# Patient Record
Sex: Female | Born: 1970 | Race: White | Hispanic: No | Marital: Married | Smoking: Never smoker
Health system: Southern US, Community
[De-identification: ages and names within clinical notes are randomized; demographics above are authoritative.]

---

## 2015-03-22 ENCOUNTER — Other Ambulatory Visit: Payer: Self-pay | Admitting: Nurse Practitioner

## 2015-03-22 DIAGNOSIS — M79604 Pain in right leg: Secondary | ICD-10-CM

## 2015-03-22 DIAGNOSIS — M79605 Pain in left leg: Principal | ICD-10-CM

## 2015-03-29 ENCOUNTER — Other Ambulatory Visit: Payer: Self-pay

## 2015-03-29 ENCOUNTER — Other Ambulatory Visit: Payer: Self-pay | Admitting: Nurse Practitioner

## 2015-03-29 DIAGNOSIS — M79604 Pain in right leg: Secondary | ICD-10-CM

## 2015-03-29 DIAGNOSIS — M79605 Pain in left leg: Principal | ICD-10-CM

## 2015-04-06 ENCOUNTER — Ambulatory Visit
Admission: RE | Admit: 2015-04-06 | Discharge: 2015-04-06 | Disposition: A | Discharge status: Home / Self Care | Payer: BLUE CROSS/BLUE SHIELD | Source: Ambulatory Visit | Attending: Nurse Practitioner | Admitting: Nurse Practitioner

## 2015-04-06 ENCOUNTER — Other Ambulatory Visit: Payer: Self-pay

## 2015-04-06 DIAGNOSIS — M79605 Pain in left leg: Principal | ICD-10-CM

## 2015-04-06 DIAGNOSIS — M79604 Pain in right leg: Secondary | ICD-10-CM | POA: Insufficient documentation

## 2015-04-06 NOTE — Consult Note (Signed)
Chief Complaint: Chief Complaint  Patient presents with  . Varicose Veins    Consult for E & M of Varicose Veins      Referring Physician(s): Fedziuk,Bernadette H  History of Present Illness: Katrina Richards is a 44 y.o. female with complaints of intermittent bilateral leg pain. The symptoms started approximately 1 year ago. The pain is very sharp and usually resolves instantaneously. The pain is intermittent and not reproducible. She says that the symptoms are primarily below the knees in the calves. The symptoms can occur during rest and exercise. No evidence for leg swelling or erythema. Patient had a nerve conduction test which was negative. Patient has no exercise limitations due to these symptoms. She will take ibuprofen occasionally for the pain. Past medical history significant for back pain.  No past medical history on file.  No past surgical history on file.  Allergies: Review of patient's allergies indicates no known allergies.  Medications: Prior to Admission medications   Not on File     No family history on file.  History   Social History  . Marital Status: Married    Spouse Name: N/A  . Number of Children: N/A  . Years of Education: N/A   Social History Main Topics  . Smoking status: Never Smoker   . Smokeless tobacco: Never Used  . Alcohol Use: No  . Drug Use: No  . Sexual Activity: Not on file   Other Topics Concern  . None   Social History Narrative  . None     Review of Systems  Musculoskeletal: Positive for back pain.    Vital Signs: BP 98/64 mmHg  Pulse 88  Temp(Src) 98.1 F (36.7 C) (Oral)  Resp 15  Ht  (1.549 m)  Wt 124 lb (56.246 kg)  BMI 23.44 kg/m2  SpO2 100%  LMP 03/31/2015 (Approximate)  Physical Exam  Constitutional: She appears well-developed and well-nourished.  Musculoskeletal:  No lower extremity swelling.  Strong palpable pulses at [facility] and PT.  No erythema. No obvious varicocities Small spider veins  along lateral left calf.       Imaging: US Venous Img Lower Bilateral  04/06/2015   CLINICAL DATA:  44 year old with intermittent bilateral calf pain. Evaluate for venous insufficiency.  EXAM: BILATERAL LOWER EXTREMITY VENOUS DUPLEX ULTRASOUND  TECHNIQUE: Gray-scale sonography with graded compression, as well as color Doppler and duplex ultrasound, were performed to evaluate the deep and superficial veins of both lower extremities. Spectral Doppler was utilized to evaluate flow at rest and with distal augmentation maneuvers. A complete superficial venous insufficiency exam was performed in the upright standing position. I personally performed the technical portion of the exam.  COMPARISON:  None.  FINDINGS: Right lower extremity: Normal compressibility, augmentation and color Doppler flow in the right common femoral vein, right femoral vein and right popliteal vein. The right saphenofemoral junction is patent.  Normal caliber of the right great saphenous vein and right short saphenous. No evidence for venous reflux.  Left lower extremity: Normal compressibility, augmentation and color Doppler flow in the left common femoral vein, left femoral vein and left popliteal vein. The left saphenofemoral junction is patent. Visualized deep left calf veins are patent.  Normal caliber of the left great saphenous vein and left short saphenous vein. No evidence for venous reflux.  IMPRESSION: Normal lower extremity venous examination. Negative for deep vein thrombosis. Negative for superficial venous insufficiency.   Electronically Signed   By: Richarda Overlie M.D.   On:  04/06/2015 10:29    Labs:  CBC: No results for input(s): WBC, HGB, HCT, PLT in the last 8760 hours.  COAGS: No results for input(s): INR, APTT in the last 8760 hours.  BMP: No results for input(s): NA, K, CL, CO2, GLUCOSE, BUN, CALCIUM, CREATININE, GFRNONAA, GFRAA in the last 8760 hours.  Invalid input(s): CMP    Assessment and  Plan:  44 year old with intermittent lower extremity pain. There is no evidence for lower extremity venous insufficiency by physical examination or ultrasound. I explained these findings to the patient. Unfortunately, I do not have a good explanation for her lower extremity symptoms. Patient asked me if an MRI would be useful. I think that an MRI of the lower extremities is going to be low yield since the pain is intermittent and there is not a focal area of concern.  No evidence for venous insufficiency and no plans for treatment. Patient will follow-up as needed.  Thank you for this interesting consult.  I greatly enjoyed meeting Miachel RouxSusan Bremner and look forward to participating in their care.  SignedAbundio Miu: Kimberely Mccannon RYAN 04/06/2015, 11:54 AM   I spent a total of  10 Minutes   in face to face in clinical consultation, greater than 50% of which was counseling/coordinating care for lower extremity pain.

## 2015-08-01 ENCOUNTER — Other Ambulatory Visit: Payer: Self-pay | Admitting: Nurse Practitioner

## 2015-08-01 DIAGNOSIS — G8929 Other chronic pain: Secondary | ICD-10-CM

## 2015-08-01 DIAGNOSIS — R51 Headache: Principal | ICD-10-CM

## 2015-08-04 ENCOUNTER — Ambulatory Visit
Admission: RE | Admit: 2015-08-04 | Discharge: 2015-08-04 | Disposition: A | Discharge status: Home / Self Care | Payer: BLUE CROSS/BLUE SHIELD | Source: Ambulatory Visit | Attending: Nurse Practitioner | Admitting: Nurse Practitioner

## 2015-08-04 DIAGNOSIS — G8929 Other chronic pain: Secondary | ICD-10-CM

## 2015-08-04 DIAGNOSIS — R51 Headache: Principal | ICD-10-CM

## 2020-01-31 ENCOUNTER — Other Ambulatory Visit: Payer: Self-pay | Admitting: Orthopedic Surgery

## 2020-01-31 DIAGNOSIS — M542 Cervicalgia: Secondary | ICD-10-CM

## 2020-02-08 ENCOUNTER — Other Ambulatory Visit: Payer: BLUE CROSS/BLUE SHIELD

## 2020-02-08 ENCOUNTER — Other Ambulatory Visit: Payer: Self-pay

## 2020-02-08 ENCOUNTER — Ambulatory Visit
Admission: RE | Admit: 2020-02-08 | Discharge: 2020-02-08 | Disposition: A | Discharge status: Home / Self Care | Payer: 59 | Source: Ambulatory Visit | Attending: Orthopedic Surgery | Admitting: Orthopedic Surgery

## 2020-02-08 DIAGNOSIS — M542 Cervicalgia: Secondary | ICD-10-CM

## 2020-02-22 ENCOUNTER — Ambulatory Visit
Admission: RE | Admit: 2020-02-22 | Discharge: 2020-02-22 | Disposition: A | Discharge status: Home / Self Care | Payer: 59 | Source: Ambulatory Visit | Attending: Orthopedic Surgery | Admitting: Orthopedic Surgery

## 2020-02-22 DIAGNOSIS — M542 Cervicalgia: Secondary | ICD-10-CM

## 2020-11-06 ENCOUNTER — Other Ambulatory Visit: Payer: Self-pay

## 2020-11-06 ENCOUNTER — Ambulatory Visit (INDEPENDENT_AMBULATORY_CARE_PROVIDER_SITE_OTHER): Payer: 59 | Admitting: Otolaryngology

## 2020-11-06 ENCOUNTER — Encounter (INDEPENDENT_AMBULATORY_CARE_PROVIDER_SITE_OTHER): Payer: Self-pay | Admitting: Otolaryngology

## 2020-11-06 VITALS — Temp 97.9°F

## 2020-11-06 DIAGNOSIS — J310 Chronic rhinitis: Secondary | ICD-10-CM

## 2020-11-06 NOTE — Progress Notes (Signed)
HPI: Katrina Richards is a 49 y.o. female who presents for evaluation of nasal sinus complaints.  She feels like there is something in her left nostril or left maxillary sinus for couple months now.  She uses Flonase on a regular basis mostly in the mornings.  She also complains of occasional trouble swallowing where she has to almost "push" the food down.  She has mild nasal congestion.  She does have history of cervical disc problems. Patient had a CT scan of her head performed in 2016 because of frontal sinus pressure and on review of the CT scan in 2016 this showed clear paranasal sinuses.  No past medical history on file.  Social History   Socioeconomic History  . Marital status: Married    Spouse name: Not on file  . Number of children: Not on file  . Years of education: Not on file  . Highest education level: Not on file  Occupational History  . Not on file  Tobacco Use  . Smoking status: Never Smoker  . Smokeless tobacco: Never Used  Substance and Sexual Activity  . Alcohol use: No    Alcohol/week: 0.0 standard drinks  . Drug use: No  . Sexual activity: Not on file  Other Topics Concern  . Not on file  Social History Narrative  . Not on file   Social Determinants of Health   Financial Resource Strain:   . Difficulty of Paying Living Expenses: Not on file  Food Insecurity:   . Worried About Programme researcher, broadcasting/film/video in the Last Year: Not on file  . Ran Out of Food in the Last Year: Not on file  Transportation Needs:   . Lack of Transportation (Medical): Not on file  . Lack of Transportation (Non-Medical): Not on file  Physical Activity:   . Days of Exercise per Week: Not on file  . Minutes of Exercise per Session: Not on file  Stress:   . Feeling of Stress : Not on file  Social Connections:   . Frequency of Communication with Friends and Family: Not on file  . Frequency of Social Gatherings with Friends and Family: Not on file  . Attends Religious Services: Not on file  .  Active Member of Clubs or Organizations: Not on file  . Attends Banker Meetings: Not on file  . Marital Status: Not on file   No family history on file. No Known Allergies Prior to Admission medications   Not on File     Positive ROS: Otherwise negative  All other systems have been reviewed and were otherwise negative with the exception of those mentioned in the HPI and as above.  Physical Exam: Constitutional: Alert, well-appearing, no acute distress Ears: External ears without lesions or tenderness. Ear canals are clear bilaterally with intact, clear TMs.  Nasal: External nose without lesions. Septum with minimal deformity.  She has mild mucosal swelling on both sides.  After decongesting the nose nasal endoscopy was performed.  Both millimeters regions were clear with no significant edema no polyps and no drainage from the middle meatus.  Nasopharynx was clear.. Oral: Lips and gums without lesions. Tongue and palate mucosa without lesions. Posterior oropharynx clear.  She is status post tonsillectomy.  Indirect laryngoscopy revealed a clear base of tongue vallecula and epiglottis. Neck: No palpable adenopathy or masses Respiratory: Breathing comfortably  Skin: No facial/neck lesions or rash noted.  Nasal/sinus endoscopy  Date/Time: 11/06/2020 5:11 PM Performed by: Drema Halon, MD Authorized by:  Drema Halon, MD   Consent:    Consent obtained:  Verbal   Consent given by:  Patient Procedure details:    Indications: sino-nasal symptoms     Medication:  Afrin   Instrument: flexible fiberoptic nasal endoscope     Scope location: bilateral nare   Septum:    normal     Deviation: deviated to the right     Severity of deviation: mild   Sinus:    Right middle meatus: normal     Left middle meatus: normal     Right nasopharynx: normal     Left nasopharynx: normal   Comments:     On nasal endoscopy nasal passages were clear bilaterally.  Both  middle meatus regions were clear anteriorly as well as posteriorly.  No significant edema noted and no polyps or mucopurulent discharge noted.    Assessment: Chronic rhinitis with clear nasal passages otherwise.  Plan: Recommended use of the Flonase at night instead of in the morning.  Also discussed with her concerning using saline irrigations for the nasal passages on a regular basis.  Reassured her of normal nasal exam otherwise on nasal endoscopy.  Narda Bonds, MD

## 2020-12-18 IMAGING — MR MR CERVICAL SPINE W/O CM
5 series · 29 of 48 positions shown · non-contrast
Comparison: None available.

CLINICAL DATA: Neck pain with daily headaches for 5 years. Left arm
and hand weakness with numbness. Patient reports falling 5 years
ago. No previous relevant surgery.

EXAM:
MRI CERVICAL SPINE WITHOUT CONTRAST
TECHNIQUE: Multiplanar, multisequence MR imaging of the cervical spine was
performed. No intravenous contrast was administered.

[Series 2: T2 · sagittal · 3.0mm · 0.41mm/px · 6 of 13 slices shown (1 of 2)]
[im 1/13]
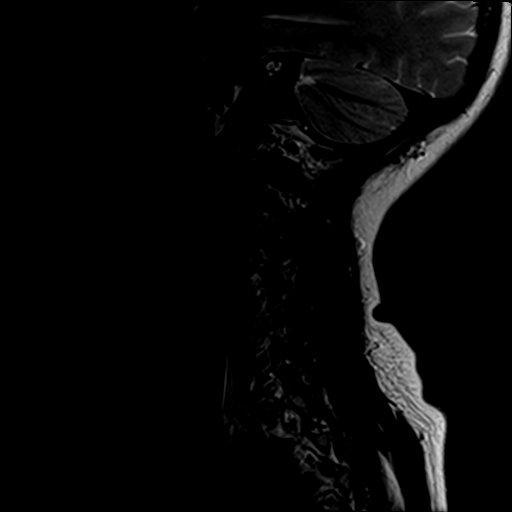
[im 3/13]
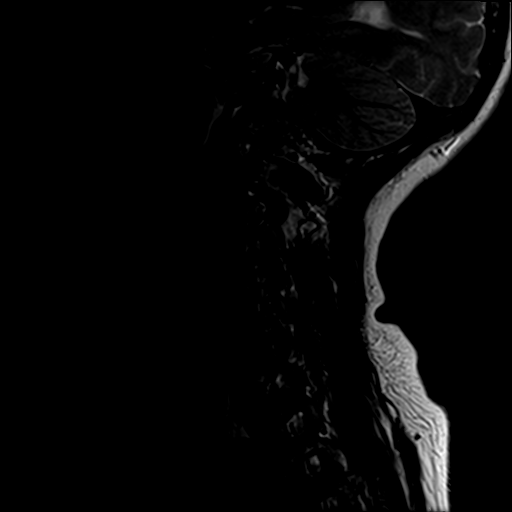
[im 5/13]
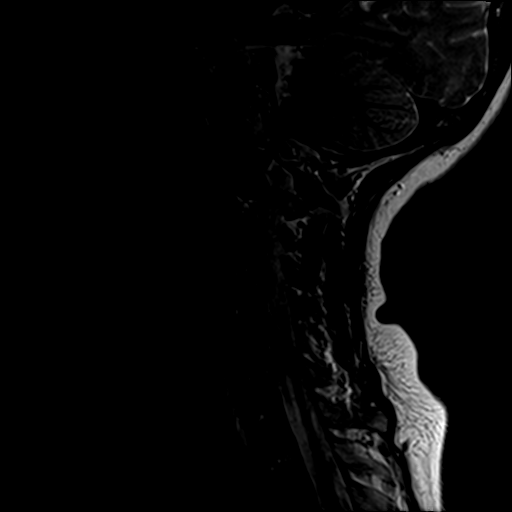
[im 8/13]
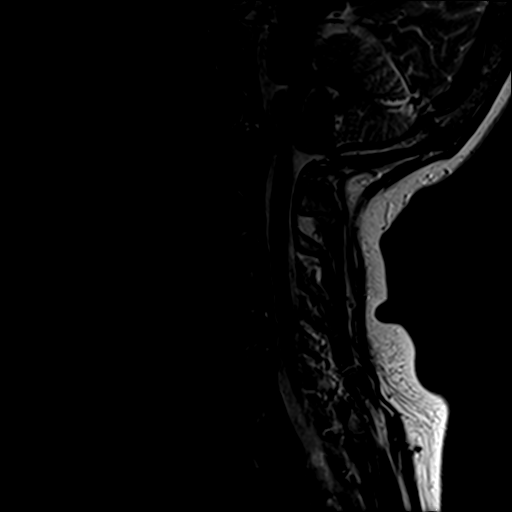
[im 10/13]
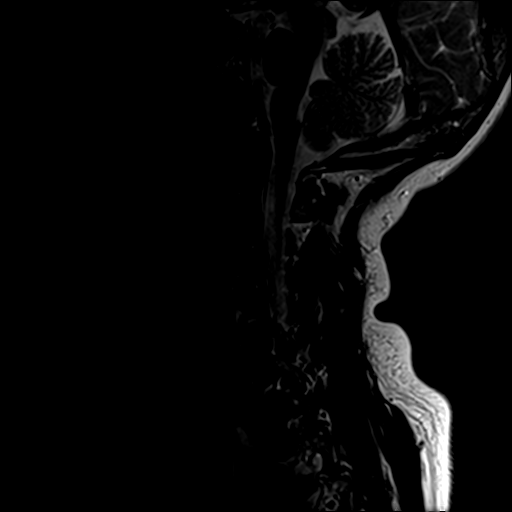
[im 13/13]
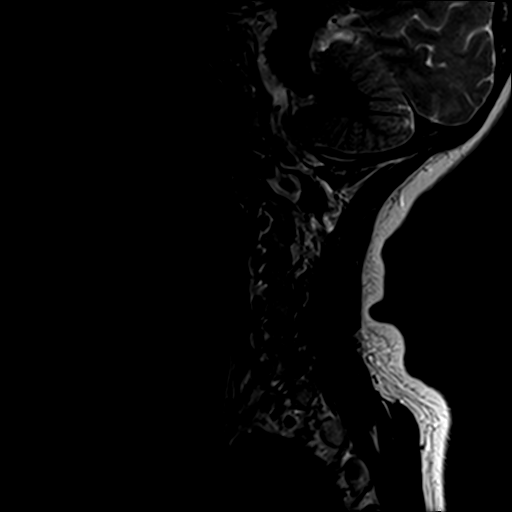

[Series 3: T1 · sagittal · 3.0mm · 0.41mm/px · 7 of 13 slices shown]
[im 1/13]
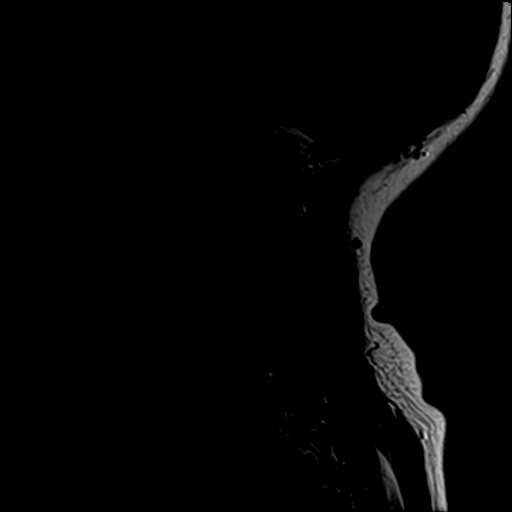
[im 3/13]
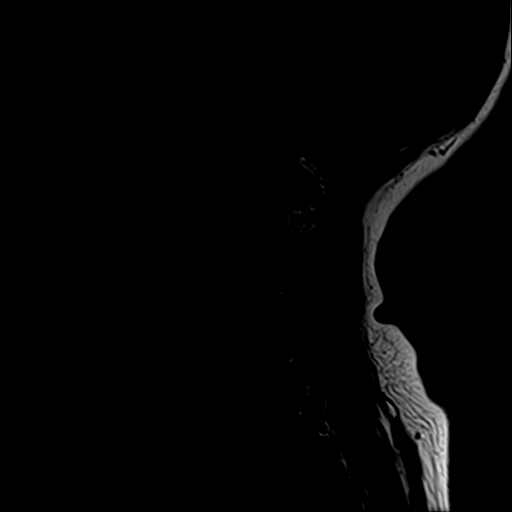
[im 5/13]
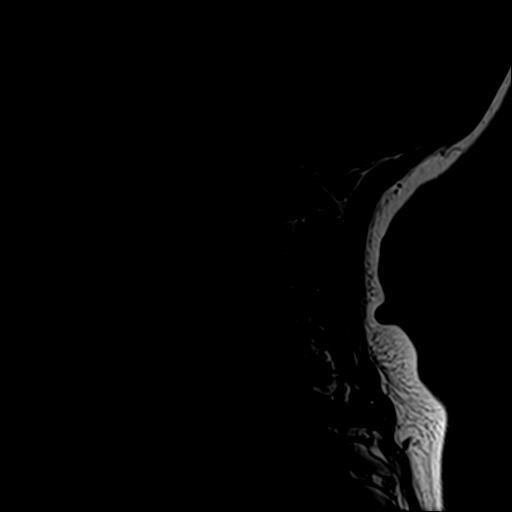
[im 7/13]
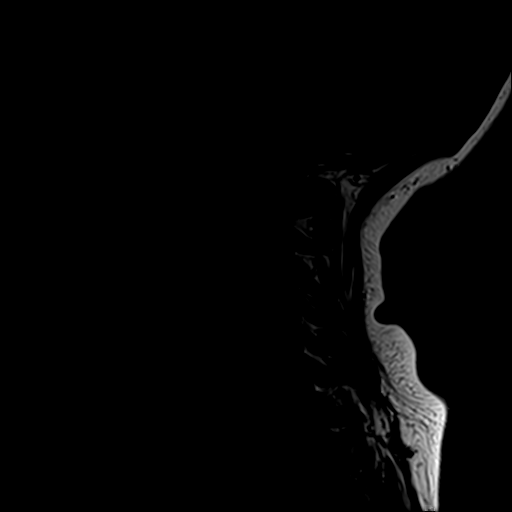
[im 9/13]
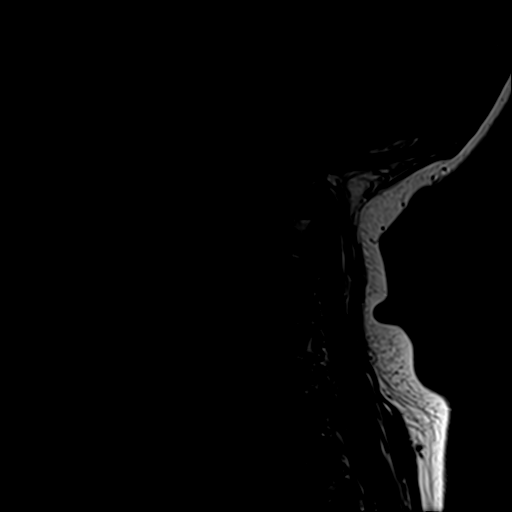
[im 11/13]
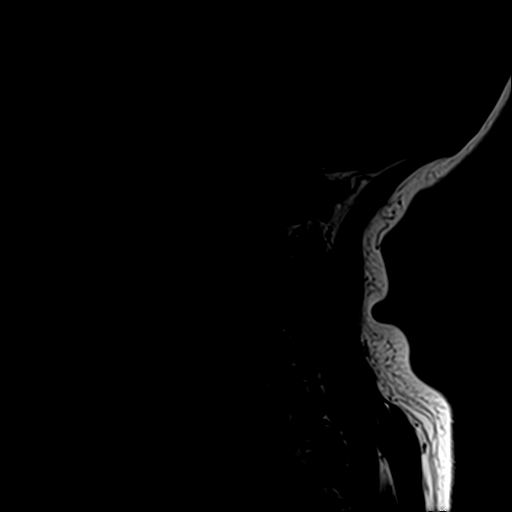
[im 13/13]
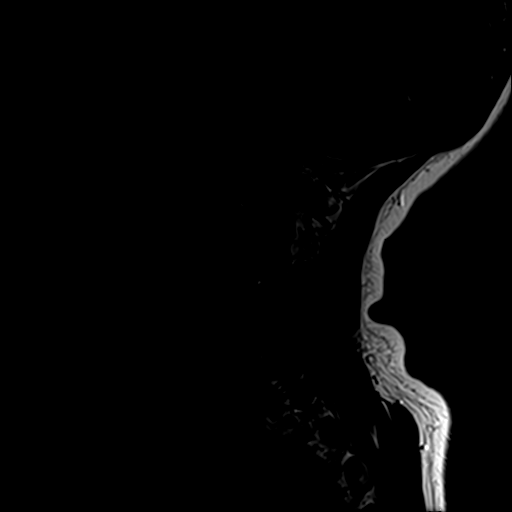

[Series 4: STIR · sagittal · 3.0mm · 0.82mm/px · 7 of 13 slices shown]
[im 1/13]
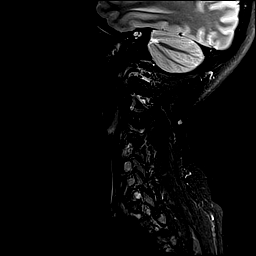
[im 3/13]
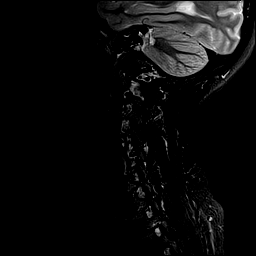
[im 5/13]
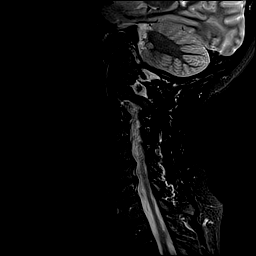
[im 7/13]
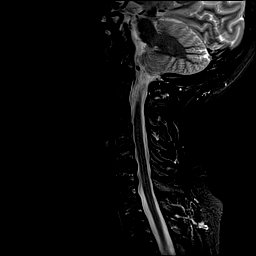
[im 9/13]
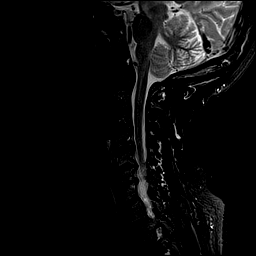
[im 11/13]
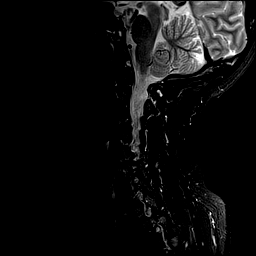
[im 13/13]
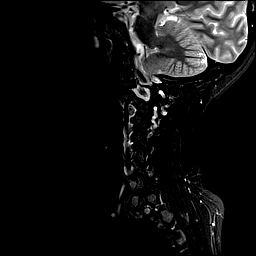

[Series 5: GRE · axial · 3.0mm · 0.35mm/px · 1 of 26 slices shown]
[im 1/26]
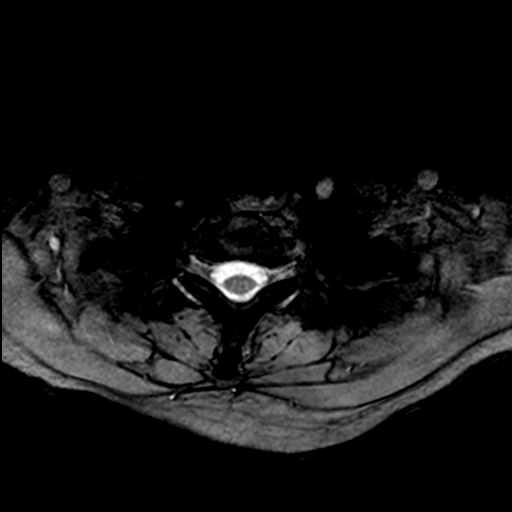

[Series 6: T2 · axial · 3.0mm · 0.70mm/px · z∈[-76,+15]mm · 8 of 26 slices shown (2 of 2)]
[im 1/26]
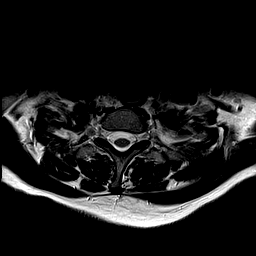
[im 4/26]
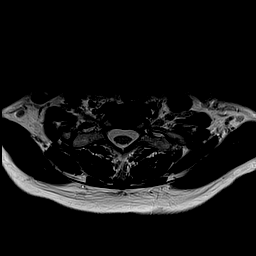
[im 8/26]
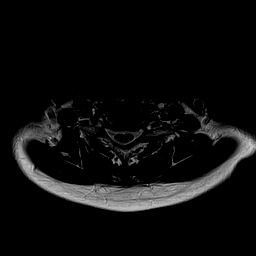
[im 12/26]
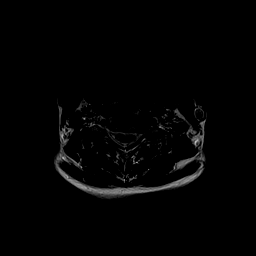
[im 14/26]
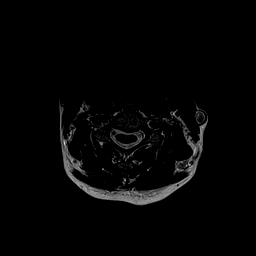
[im 18/26]
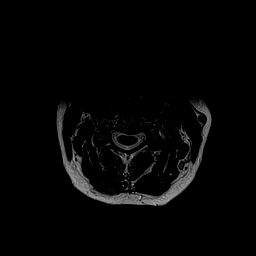
[im 22/26]
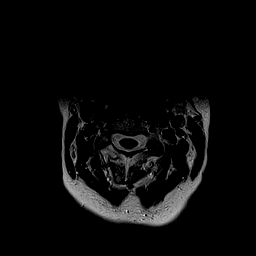
[im 26/26]
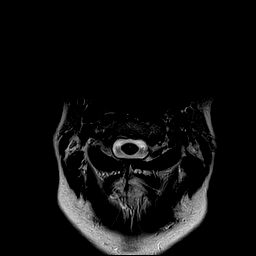

[29 of 48 positions shown; findings below may reference images not displayed]

FINDINGS: Alignment: Normal.

Vertebrae: No acute or suspicious osseous findings.

Cord: Normal in signal and caliber.

Posterior Fossa, vertebral arteries, paraspinal tissues: Visualized
portions of the posterior fossa and paraspinal soft tissues appear
unremarkable. Bilateral vertebral artery flow voids.

Disc levels:

C2-3: Normal interspace.

C3-4: Minimal disc bulging and uncinate spurring. No spinal stenosis
or nerve root encroachment.

C4-5: Minimal disc bulging and uncinate spurring. No spinal stenosis
or nerve root encroachment.

C5-6: Spondylosis with loss of disc height and posterior osteophytes
covering diffusely bulging disc material. There is a left foraminal
disc osteophyte complex which contributes to moderate left foraminal
narrowing and possible left C6 nerve root encroachment. The right
foramen is mildly narrowed. No cord deformity.

C6-7: Spondylosis with mild loss of disc height and posterior
osteophytes covering diffusely bulging disc material. Mild foraminal
narrowing bilaterally. No cord deformity.

C7-T1: Mild bilateral facet hypertrophy. No spinal stenosis or nerve
root encroachment.
IMPRESSION: 1. Spondylosis at C5-6 with a left foraminal disc osteophyte complex
causing moderate left foraminal narrowing and possible left C6 nerve
root encroachment.
2. Spondylosis at C6-7 with mild foraminal narrowing bilaterally.
3. No cord deformity or abnormal cord signal.

## 2023-02-12 ENCOUNTER — Other Ambulatory Visit: Payer: Self-pay | Admitting: Obstetrics and Gynecology

## 2023-02-12 DIAGNOSIS — R928 Other abnormal and inconclusive findings on diagnostic imaging of breast: Secondary | ICD-10-CM

## 2023-02-24 ENCOUNTER — Ambulatory Visit
Admission: RE | Admit: 2023-02-24 | Discharge: 2023-02-24 | Disposition: A | Discharge status: Home / Self Care | Payer: 59 | Source: Ambulatory Visit | Attending: Obstetrics and Gynecology | Admitting: Obstetrics and Gynecology

## 2023-02-24 ENCOUNTER — Ambulatory Visit
Admission: RE | Admit: 2023-02-24 | Discharge: 2023-02-24 | Disposition: A | Discharge status: Home / Self Care | Payer: BC Managed Care – PPO | Source: Ambulatory Visit | Attending: Obstetrics and Gynecology | Admitting: Obstetrics and Gynecology

## 2023-02-24 DIAGNOSIS — R928 Other abnormal and inconclusive findings on diagnostic imaging of breast: Secondary | ICD-10-CM
